# Patient Record
Sex: Male | Born: 1990
Health system: Southern US, Community
[De-identification: ages and names within clinical notes are randomized; demographics above are authoritative.]

---
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
## 2013-02-10 ENCOUNTER — Other Ambulatory Visit: Payer: Self-pay | Admitting: *Deleted

## 2013-02-10 ENCOUNTER — Other Ambulatory Visit: Payer: Self-pay | Admitting: Urology

## 2013-02-10 DIAGNOSIS — N451 Epididymitis: Secondary | ICD-10-CM

## 2013-02-11 ENCOUNTER — Other Ambulatory Visit: Payer: Self-pay | Admitting: Urology

## 2013-02-11 ENCOUNTER — Ambulatory Visit
Admission: RE | Admit: 2013-02-11 | Discharge: 2013-02-11 | Disposition: A | Discharge status: Home / Self Care | Payer: BC Managed Care – PPO | Source: Ambulatory Visit | Attending: *Deleted | Admitting: *Deleted

## 2013-02-11 DIAGNOSIS — N451 Epididymitis: Secondary | ICD-10-CM

## 2013-05-04 ENCOUNTER — Ambulatory Visit
Admission: RE | Admit: 2013-05-04 | Discharge: 2013-05-04 | Disposition: A | Discharge status: Home / Self Care | Payer: BC Managed Care – PPO | Source: Ambulatory Visit | Attending: Internal Medicine | Admitting: Internal Medicine

## 2013-05-04 ENCOUNTER — Other Ambulatory Visit: Payer: Self-pay | Admitting: Internal Medicine

## 2013-05-04 DIAGNOSIS — R519 Headache, unspecified: Secondary | ICD-10-CM

## 2015-07-26 ENCOUNTER — Encounter: Payer: Self-pay | Admitting: Family Medicine

## 2015-07-26 ENCOUNTER — Ambulatory Visit (INDEPENDENT_AMBULATORY_CARE_PROVIDER_SITE_OTHER): Payer: BLUE CROSS/BLUE SHIELD

## 2015-07-26 ENCOUNTER — Ambulatory Visit (INDEPENDENT_AMBULATORY_CARE_PROVIDER_SITE_OTHER): Payer: BLUE CROSS/BLUE SHIELD | Admitting: Family Medicine

## 2015-07-26 ENCOUNTER — Other Ambulatory Visit: Payer: Self-pay | Admitting: Physician Assistant

## 2015-07-26 DIAGNOSIS — M79671 Pain in right foot: Secondary | ICD-10-CM

## 2015-07-26 DIAGNOSIS — R0781 Pleurodynia: Secondary | ICD-10-CM | POA: Diagnosis not present

## 2015-07-26 DIAGNOSIS — Z23 Encounter for immunization: Secondary | ICD-10-CM

## 2015-07-26 LAB — POCT URINALYSIS DIPSTICK
BILIRUBIN UA: NEGATIVE
Glucose, UA: NEGATIVE
LEUKOCYTES UA: NEGATIVE
Nitrite, UA: NEGATIVE
Protein, UA: NEGATIVE
Spec Grav, UA: 1.025
Urobilinogen, UA: 0.2
pH, UA: 6.5

## 2015-07-26 MED ORDER — NAPROXEN 500 MG PO TABS: 500.0000 mg | ORAL_TABLET | Freq: Two times a day (BID) | ORAL | Status: AC

## 2015-07-26 MED ORDER — HYDROCODONE-ACETAMINOPHEN 5-325 MG PO TABS: 1.0000 | ORAL_TABLET | Freq: Four times a day (QID) | ORAL | Status: AC | PRN
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
## 2016-03-03 DIAGNOSIS — R1032 Left lower quadrant pain: Secondary | ICD-10-CM | POA: Diagnosis not present

## 2016-07-28 DIAGNOSIS — J029 Acute pharyngitis, unspecified: Secondary | ICD-10-CM | POA: Diagnosis not present

## 2016-07-28 DIAGNOSIS — J069 Acute upper respiratory infection, unspecified: Secondary | ICD-10-CM | POA: Diagnosis not present

## 2016-07-28 DIAGNOSIS — R05 Cough: Secondary | ICD-10-CM | POA: Diagnosis not present

## 2016-08-04 DIAGNOSIS — J029 Acute pharyngitis, unspecified: Secondary | ICD-10-CM | POA: Diagnosis not present
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
## 2017-03-19 DIAGNOSIS — Z0000 Encounter for general adult medical examination without abnormal findings: Secondary | ICD-10-CM | POA: Diagnosis not present

## 2017-03-24 DIAGNOSIS — Z0000 Encounter for general adult medical examination without abnormal findings: Secondary | ICD-10-CM | POA: Diagnosis not present

## 2017-04-09 DIAGNOSIS — D225 Melanocytic nevi of trunk: Secondary | ICD-10-CM | POA: Diagnosis not present

## 2017-04-09 DIAGNOSIS — D239 Other benign neoplasm of skin, unspecified: Secondary | ICD-10-CM | POA: Diagnosis not present

## 2017-05-05 DIAGNOSIS — D225 Melanocytic nevi of trunk: Secondary | ICD-10-CM | POA: Diagnosis not present

## 2017-05-05 DIAGNOSIS — D1801 Hemangioma of skin and subcutaneous tissue: Secondary | ICD-10-CM | POA: Diagnosis not present

## 2017-05-05 DIAGNOSIS — D2262 Melanocytic nevi of left upper limb, including shoulder: Secondary | ICD-10-CM | POA: Diagnosis not present

## 2017-05-05 DIAGNOSIS — L814 Other melanin hyperpigmentation: Secondary | ICD-10-CM | POA: Diagnosis not present

## 2017-06-29 DIAGNOSIS — M7711 Lateral epicondylitis, right elbow: Secondary | ICD-10-CM | POA: Diagnosis not present

## 2017-07-27 DIAGNOSIS — M7711 Lateral epicondylitis, right elbow: Secondary | ICD-10-CM | POA: Diagnosis not present

## 2020-04-06 DIAGNOSIS — M545 Low back pain: Secondary | ICD-10-CM | POA: Diagnosis not present

## 2020-04-06 DIAGNOSIS — M546 Pain in thoracic spine: Secondary | ICD-10-CM | POA: Diagnosis not present

## 2020-04-13 DIAGNOSIS — M545 Low back pain: Secondary | ICD-10-CM | POA: Diagnosis not present

## 2020-04-17 DIAGNOSIS — M545 Low back pain: Secondary | ICD-10-CM | POA: Diagnosis not present

## 2020-04-23 DIAGNOSIS — F411 Generalized anxiety disorder: Secondary | ICD-10-CM | POA: Diagnosis not present

## 2020-05-02 DIAGNOSIS — F411 Generalized anxiety disorder: Secondary | ICD-10-CM | POA: Diagnosis not present

## 2020-05-23 DIAGNOSIS — F411 Generalized anxiety disorder: Secondary | ICD-10-CM | POA: Diagnosis not present

## 2020-06-21 DIAGNOSIS — F411 Generalized anxiety disorder: Secondary | ICD-10-CM | POA: Diagnosis not present

## 2020-06-29 DIAGNOSIS — F419 Anxiety disorder, unspecified: Secondary | ICD-10-CM | POA: Diagnosis not present

## 2020-06-29 DIAGNOSIS — F329 Major depressive disorder, single episode, unspecified: Secondary | ICD-10-CM | POA: Diagnosis not present

## 2020-07-04 DIAGNOSIS — F411 Generalized anxiety disorder: Secondary | ICD-10-CM | POA: Diagnosis not present

## 2020-07-18 DIAGNOSIS — F411 Generalized anxiety disorder: Secondary | ICD-10-CM | POA: Diagnosis not present

## 2020-08-03 DIAGNOSIS — F419 Anxiety disorder, unspecified: Secondary | ICD-10-CM | POA: Diagnosis not present

## 2020-08-03 DIAGNOSIS — F329 Major depressive disorder, single episode, unspecified: Secondary | ICD-10-CM | POA: Diagnosis not present

## 2020-08-06 DIAGNOSIS — F411 Generalized anxiety disorder: Secondary | ICD-10-CM | POA: Diagnosis not present

## 2020-08-20 DIAGNOSIS — F411 Generalized anxiety disorder: Secondary | ICD-10-CM | POA: Diagnosis not present

## 2020-09-10 DIAGNOSIS — F411 Generalized anxiety disorder: Secondary | ICD-10-CM | POA: Diagnosis not present

## 2020-09-11 DIAGNOSIS — G8929 Other chronic pain: Secondary | ICD-10-CM | POA: Diagnosis not present

## 2020-09-11 DIAGNOSIS — M545 Low back pain, unspecified: Secondary | ICD-10-CM | POA: Diagnosis not present

## 2020-09-11 DIAGNOSIS — F329 Major depressive disorder, single episode, unspecified: Secondary | ICD-10-CM | POA: Diagnosis not present

## 2020-09-24 DIAGNOSIS — F411 Generalized anxiety disorder: Secondary | ICD-10-CM | POA: Diagnosis not present

## 2020-10-17 DIAGNOSIS — M545 Low back pain, unspecified: Secondary | ICD-10-CM | POA: Diagnosis not present

## 2020-10-24 DIAGNOSIS — M545 Low back pain, unspecified: Secondary | ICD-10-CM | POA: Diagnosis not present

## 2020-11-07 DIAGNOSIS — M25551 Pain in right hip: Secondary | ICD-10-CM | POA: Diagnosis not present

## 2020-11-07 DIAGNOSIS — M546 Pain in thoracic spine: Secondary | ICD-10-CM | POA: Diagnosis not present

## 2020-11-07 DIAGNOSIS — M25521 Pain in right elbow: Secondary | ICD-10-CM | POA: Diagnosis not present

## 2020-11-07 DIAGNOSIS — M25522 Pain in left elbow: Secondary | ICD-10-CM | POA: Diagnosis not present

## 2020-11-12 DIAGNOSIS — F411 Generalized anxiety disorder: Secondary | ICD-10-CM | POA: Diagnosis not present

## 2020-11-13 DIAGNOSIS — M25522 Pain in left elbow: Secondary | ICD-10-CM | POA: Diagnosis not present

## 2020-11-13 DIAGNOSIS — M25551 Pain in right hip: Secondary | ICD-10-CM | POA: Diagnosis not present

## 2020-11-13 DIAGNOSIS — M546 Pain in thoracic spine: Secondary | ICD-10-CM | POA: Diagnosis not present

## 2020-11-13 DIAGNOSIS — M25521 Pain in right elbow: Secondary | ICD-10-CM | POA: Diagnosis not present

## 2020-11-27 DIAGNOSIS — M25521 Pain in right elbow: Secondary | ICD-10-CM | POA: Diagnosis not present

## 2020-11-27 DIAGNOSIS — F411 Generalized anxiety disorder: Secondary | ICD-10-CM | POA: Diagnosis not present

## 2020-11-27 DIAGNOSIS — M25522 Pain in left elbow: Secondary | ICD-10-CM | POA: Diagnosis not present

## 2020-11-27 DIAGNOSIS — M25551 Pain in right hip: Secondary | ICD-10-CM | POA: Diagnosis not present

## 2020-11-27 DIAGNOSIS — M546 Pain in thoracic spine: Secondary | ICD-10-CM | POA: Diagnosis not present

## 2020-12-25 DIAGNOSIS — F411 Generalized anxiety disorder: Secondary | ICD-10-CM | POA: Diagnosis not present

## 2021-01-17 DIAGNOSIS — F411 Generalized anxiety disorder: Secondary | ICD-10-CM | POA: Diagnosis not present

## 2021-02-04 DIAGNOSIS — F411 Generalized anxiety disorder: Secondary | ICD-10-CM | POA: Diagnosis not present

## 2021-02-22 DIAGNOSIS — M25551 Pain in right hip: Secondary | ICD-10-CM | POA: Diagnosis not present

## 2021-02-22 DIAGNOSIS — M25521 Pain in right elbow: Secondary | ICD-10-CM | POA: Diagnosis not present

## 2021-02-22 DIAGNOSIS — M546 Pain in thoracic spine: Secondary | ICD-10-CM | POA: Diagnosis not present

## 2021-02-22 DIAGNOSIS — M25522 Pain in left elbow: Secondary | ICD-10-CM | POA: Diagnosis not present

## 2021-02-28 DIAGNOSIS — F411 Generalized anxiety disorder: Secondary | ICD-10-CM | POA: Diagnosis not present

## 2021-03-14 DIAGNOSIS — F411 Generalized anxiety disorder: Secondary | ICD-10-CM | POA: Diagnosis not present

## 2021-04-02 DIAGNOSIS — F411 Generalized anxiety disorder: Secondary | ICD-10-CM | POA: Diagnosis not present

## 2021-04-24 DIAGNOSIS — M546 Pain in thoracic spine: Secondary | ICD-10-CM | POA: Diagnosis not present

## 2021-04-24 DIAGNOSIS — M25551 Pain in right hip: Secondary | ICD-10-CM | POA: Diagnosis not present

## 2021-04-24 DIAGNOSIS — M25522 Pain in left elbow: Secondary | ICD-10-CM | POA: Diagnosis not present

## 2021-04-24 DIAGNOSIS — M25521 Pain in right elbow: Secondary | ICD-10-CM | POA: Diagnosis not present

## 2021-07-04 DIAGNOSIS — M25522 Pain in left elbow: Secondary | ICD-10-CM | POA: Diagnosis not present

## 2021-07-04 DIAGNOSIS — M25551 Pain in right hip: Secondary | ICD-10-CM | POA: Diagnosis not present

## 2021-07-04 DIAGNOSIS — M25521 Pain in right elbow: Secondary | ICD-10-CM | POA: Diagnosis not present

## 2021-07-04 DIAGNOSIS — M546 Pain in thoracic spine: Secondary | ICD-10-CM | POA: Diagnosis not present

## 2021-07-05 DIAGNOSIS — M25531 Pain in right wrist: Secondary | ICD-10-CM | POA: Diagnosis not present

## 2021-07-24 DIAGNOSIS — F411 Generalized anxiety disorder: Secondary | ICD-10-CM | POA: Diagnosis not present

## 2021-07-26 DIAGNOSIS — G5601 Carpal tunnel syndrome, right upper limb: Secondary | ICD-10-CM | POA: Diagnosis not present

## 2021-08-06 DIAGNOSIS — F411 Generalized anxiety disorder: Secondary | ICD-10-CM | POA: Diagnosis not present

## 2021-08-21 DIAGNOSIS — F411 Generalized anxiety disorder: Secondary | ICD-10-CM | POA: Diagnosis not present

## 2021-09-06 DIAGNOSIS — M546 Pain in thoracic spine: Secondary | ICD-10-CM | POA: Diagnosis not present

## 2021-09-06 DIAGNOSIS — M25522 Pain in left elbow: Secondary | ICD-10-CM | POA: Diagnosis not present

## 2021-09-06 DIAGNOSIS — M25521 Pain in right elbow: Secondary | ICD-10-CM | POA: Diagnosis not present

## 2021-09-06 DIAGNOSIS — M25551 Pain in right hip: Secondary | ICD-10-CM | POA: Diagnosis not present

## 2021-09-10 DIAGNOSIS — F411 Generalized anxiety disorder: Secondary | ICD-10-CM | POA: Diagnosis not present

## 2021-09-24 DIAGNOSIS — F411 Generalized anxiety disorder: Secondary | ICD-10-CM | POA: Diagnosis not present

## 2021-11-07 DIAGNOSIS — J029 Acute pharyngitis, unspecified: Secondary | ICD-10-CM | POA: Diagnosis not present

## 2022-01-29 DIAGNOSIS — J029 Acute pharyngitis, unspecified: Secondary | ICD-10-CM | POA: Diagnosis not present

## 2022-03-10 DIAGNOSIS — F419 Anxiety disorder, unspecified: Secondary | ICD-10-CM | POA: Diagnosis not present

## 2022-03-10 DIAGNOSIS — G8929 Other chronic pain: Secondary | ICD-10-CM | POA: Diagnosis not present

## 2022-03-10 DIAGNOSIS — F329 Major depressive disorder, single episode, unspecified: Secondary | ICD-10-CM | POA: Diagnosis not present

## 2022-04-24 DIAGNOSIS — M25522 Pain in left elbow: Secondary | ICD-10-CM | POA: Diagnosis not present

## 2022-04-24 DIAGNOSIS — M25551 Pain in right hip: Secondary | ICD-10-CM | POA: Diagnosis not present

## 2022-04-24 DIAGNOSIS — M546 Pain in thoracic spine: Secondary | ICD-10-CM | POA: Diagnosis not present

## 2022-04-24 DIAGNOSIS — M25521 Pain in right elbow: Secondary | ICD-10-CM | POA: Diagnosis not present

## 2022-08-19 DIAGNOSIS — M25551 Pain in right hip: Secondary | ICD-10-CM | POA: Diagnosis not present

## 2022-08-19 DIAGNOSIS — M25522 Pain in left elbow: Secondary | ICD-10-CM | POA: Diagnosis not present

## 2022-08-19 DIAGNOSIS — M546 Pain in thoracic spine: Secondary | ICD-10-CM | POA: Diagnosis not present

## 2022-08-19 DIAGNOSIS — M25521 Pain in right elbow: Secondary | ICD-10-CM | POA: Diagnosis not present

## 2022-08-26 DIAGNOSIS — M546 Pain in thoracic spine: Secondary | ICD-10-CM | POA: Diagnosis not present

## 2022-08-26 DIAGNOSIS — M25551 Pain in right hip: Secondary | ICD-10-CM | POA: Diagnosis not present

## 2022-08-26 DIAGNOSIS — M25522 Pain in left elbow: Secondary | ICD-10-CM | POA: Diagnosis not present

## 2022-08-26 DIAGNOSIS — M25521 Pain in right elbow: Secondary | ICD-10-CM | POA: Diagnosis not present

## 2022-09-04 DIAGNOSIS — Z0000 Encounter for general adult medical examination without abnormal findings: Secondary | ICD-10-CM | POA: Diagnosis not present

## 2022-09-10 DIAGNOSIS — F419 Anxiety disorder, unspecified: Secondary | ICD-10-CM | POA: Diagnosis not present

## 2022-09-10 DIAGNOSIS — Z0000 Encounter for general adult medical examination without abnormal findings: Secondary | ICD-10-CM | POA: Diagnosis not present

## 2022-09-10 DIAGNOSIS — D485 Neoplasm of uncertain behavior of skin: Secondary | ICD-10-CM | POA: Diagnosis not present

## 2022-09-10 DIAGNOSIS — F329 Major depressive disorder, single episode, unspecified: Secondary | ICD-10-CM | POA: Diagnosis not present

## 2022-09-10 DIAGNOSIS — J309 Allergic rhinitis, unspecified: Secondary | ICD-10-CM | POA: Diagnosis not present

## 2022-09-17 DIAGNOSIS — M25521 Pain in right elbow: Secondary | ICD-10-CM | POA: Diagnosis not present

## 2022-09-17 DIAGNOSIS — M25551 Pain in right hip: Secondary | ICD-10-CM | POA: Diagnosis not present

## 2022-09-17 DIAGNOSIS — M25522 Pain in left elbow: Secondary | ICD-10-CM | POA: Diagnosis not present

## 2022-09-17 DIAGNOSIS — M546 Pain in thoracic spine: Secondary | ICD-10-CM | POA: Diagnosis not present

## 2022-09-26 DIAGNOSIS — M546 Pain in thoracic spine: Secondary | ICD-10-CM | POA: Diagnosis not present

## 2022-09-26 DIAGNOSIS — M25521 Pain in right elbow: Secondary | ICD-10-CM | POA: Diagnosis not present

## 2022-09-26 DIAGNOSIS — M25522 Pain in left elbow: Secondary | ICD-10-CM | POA: Diagnosis not present

## 2022-09-26 DIAGNOSIS — M25551 Pain in right hip: Secondary | ICD-10-CM | POA: Diagnosis not present

## 2022-10-29 DIAGNOSIS — A630 Anogenital (venereal) warts: Secondary | ICD-10-CM | POA: Diagnosis not present

## 2022-10-29 DIAGNOSIS — L720 Epidermal cyst: Secondary | ICD-10-CM | POA: Diagnosis not present

## 2022-11-19 DIAGNOSIS — A630 Anogenital (venereal) warts: Secondary | ICD-10-CM | POA: Diagnosis not present

## 2023-07-20 DIAGNOSIS — H6982 Other specified disorders of Eustachian tube, left ear: Secondary | ICD-10-CM | POA: Diagnosis not present

## 2023-07-20 DIAGNOSIS — H6502 Acute serous otitis media, left ear: Secondary | ICD-10-CM | POA: Diagnosis not present

## 2023-09-28 DIAGNOSIS — Z0000 Encounter for general adult medical examination without abnormal findings: Secondary | ICD-10-CM | POA: Diagnosis not present

## 2023-10-01 DIAGNOSIS — Z0000 Encounter for general adult medical examination without abnormal findings: Secondary | ICD-10-CM | POA: Diagnosis not present

## 2023-11-26 DIAGNOSIS — B349 Viral infection, unspecified: Secondary | ICD-10-CM | POA: Diagnosis not present

## 2023-11-26 DIAGNOSIS — R509 Fever, unspecified: Secondary | ICD-10-CM | POA: Diagnosis not present

## 2023-11-26 DIAGNOSIS — J3489 Other specified disorders of nose and nasal sinuses: Secondary | ICD-10-CM | POA: Diagnosis not present

## 2023-11-26 DIAGNOSIS — M791 Myalgia, unspecified site: Secondary | ICD-10-CM | POA: Diagnosis not present

## 2023-12-10 DIAGNOSIS — R6883 Chills (without fever): Secondary | ICD-10-CM | POA: Diagnosis not present

## 2023-12-10 DIAGNOSIS — J0101 Acute recurrent maxillary sinusitis: Secondary | ICD-10-CM | POA: Diagnosis not present

## 2023-12-10 DIAGNOSIS — R52 Pain, unspecified: Secondary | ICD-10-CM | POA: Diagnosis not present

## 2023-12-10 DIAGNOSIS — R509 Fever, unspecified: Secondary | ICD-10-CM | POA: Diagnosis not present

## 2024-07-24 DIAGNOSIS — S61001A Unspecified open wound of right thumb without damage to nail, initial encounter: Secondary | ICD-10-CM | POA: Diagnosis not present

## 2024-07-24 DIAGNOSIS — S61214A Laceration without foreign body of right ring finger without damage to nail, initial encounter: Secondary | ICD-10-CM | POA: Diagnosis not present
